# Patient Record
Sex: Male | Born: 1985 | Race: Black or African American | Hispanic: No | Marital: Single | Smoking: Former smoker
Health system: Southern US, Community
[De-identification: ages and names within clinical notes are randomized; demographics above are authoritative.]

---
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
## 2005-12-01 ENCOUNTER — Emergency Department (HOSPITAL_COMMUNITY): Admission: EM | Admit: 2005-12-01 | Discharge: 2005-12-01 | Payer: Self-pay | Admitting: Emergency Medicine

## 2007-08-23 ENCOUNTER — Emergency Department (HOSPITAL_COMMUNITY): Admission: EM | Admit: 2007-08-23 | Discharge: 2007-08-23 | Payer: Self-pay | Admitting: Emergency Medicine

## 2008-10-05 ENCOUNTER — Emergency Department (HOSPITAL_COMMUNITY): Admission: EM | Admit: 2008-10-05 | Discharge: 2008-10-05 | Payer: Self-pay | Admitting: Emergency Medicine

## 2011-04-23 LAB — RAPID STREP SCREEN (MED CTR MEBANE ONLY): Streptococcus, Group A Screen (Direct): NEGATIVE

## 2012-02-23 ENCOUNTER — Emergency Department (HOSPITAL_COMMUNITY)
Admission: EM | Admit: 2012-02-23 | Discharge: 2012-02-23 | Disposition: A | Discharge status: Home / Self Care | Payer: Self-pay | Attending: Emergency Medicine | Admitting: Emergency Medicine

## 2012-02-23 ENCOUNTER — Emergency Department (HOSPITAL_COMMUNITY): Payer: Self-pay

## 2012-02-23 ENCOUNTER — Encounter (HOSPITAL_COMMUNITY): Payer: Self-pay | Admitting: *Deleted

## 2012-02-23 DIAGNOSIS — S52509A Unspecified fracture of the lower end of unspecified radius, initial encounter for closed fracture: Secondary | ICD-10-CM

## 2012-02-23 DIAGNOSIS — S52599A Other fractures of lower end of unspecified radius, initial encounter for closed fracture: Secondary | ICD-10-CM | POA: Insufficient documentation

## 2012-02-23 DIAGNOSIS — F17200 Nicotine dependence, unspecified, uncomplicated: Secondary | ICD-10-CM | POA: Insufficient documentation

## 2012-02-23 MED ORDER — OXYCODONE-ACETAMINOPHEN 5-325 MG PO TABS
1.0000 | ORAL_TABLET | Freq: Once | ORAL | Status: AC
  Administered 2012-02-23: 1 via ORAL
  Filled 2012-02-23: qty 1

## 2012-02-23 MED ORDER — OXYCODONE-ACETAMINOPHEN 5-325 MG PO TABS: 1.0000 | ORAL_TABLET | ORAL | Status: AC | PRN
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
## 2012-02-23 NOTE — ED Notes (Signed)
Ortho paged for splint application. 
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
## 2012-02-23 NOTE — Progress Notes (Signed)
Orthopedic Tech Progress Note Patient Details:  Jack Green [birth year] [id number]  Ortho Devices Type of Ortho Device: Arm foam sling;Sugartong splint;Ace wrap Ortho Device/Splint Location: (L) LE Ortho Device/Splint Interventions: Application   [name] 02/23/2012, 10:40 PM
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
## 2012-02-24 NOTE — ED Provider Notes (Signed)
Medical screening examination/treatment/procedure(s) were performed by non-physician practitioner and as supervising physician I was immediately available for consultation/collaboration.   Carleene Cooper III, MD 02/24/12 1235

## 2012-09-28 ENCOUNTER — Emergency Department (HOSPITAL_COMMUNITY)
Admission: EM | Admit: 2012-09-28 | Discharge: 2012-09-28 | Disposition: A | Discharge status: Home / Self Care | Payer: No Typology Code available for payment source | Attending: Emergency Medicine | Admitting: Emergency Medicine

## 2012-09-28 ENCOUNTER — Encounter (HOSPITAL_COMMUNITY): Payer: Self-pay | Admitting: Adult Health

## 2012-09-28 ENCOUNTER — Emergency Department (HOSPITAL_COMMUNITY): Payer: No Typology Code available for payment source

## 2012-09-28 DIAGNOSIS — Y939 Activity, unspecified: Secondary | ICD-10-CM | POA: Insufficient documentation

## 2012-09-28 DIAGNOSIS — Z23 Encounter for immunization: Secondary | ICD-10-CM | POA: Insufficient documentation

## 2012-09-28 DIAGNOSIS — S8990XA Unspecified injury of unspecified lower leg, initial encounter: Secondary | ICD-10-CM | POA: Insufficient documentation

## 2012-09-28 DIAGNOSIS — S199XXA Unspecified injury of neck, initial encounter: Secondary | ICD-10-CM | POA: Insufficient documentation

## 2012-09-28 DIAGNOSIS — M7918 Myalgia, other site: Secondary | ICD-10-CM

## 2012-09-28 DIAGNOSIS — F17200 Nicotine dependence, unspecified, uncomplicated: Secondary | ICD-10-CM | POA: Insufficient documentation

## 2012-09-28 DIAGNOSIS — Y92410 Unspecified street and highway as the place of occurrence of the external cause: Secondary | ICD-10-CM | POA: Insufficient documentation

## 2012-09-28 DIAGNOSIS — S0993XA Unspecified injury of face, initial encounter: Secondary | ICD-10-CM | POA: Insufficient documentation

## 2012-09-28 MED ORDER — TETANUS-DIPHTH-ACELL PERTUSSIS 5-2.5-18.5 LF-MCG/0.5 IM SUSP
0.5000 mL | Freq: Once | INTRAMUSCULAR | Status: AC
  Administered 2012-09-28: 0.5 mL via INTRAMUSCULAR
  Filled 2012-09-28: qty 0.5

## 2012-09-28 MED ORDER — OXYCODONE-ACETAMINOPHEN 5-325 MG PO TABS: ORAL_TABLET | ORAL | Status: DC

## 2012-09-28 MED ORDER — IBUPROFEN 400 MG PO TABS
400.0000 mg | ORAL_TABLET | Freq: Once | ORAL | Status: AC
  Administered 2012-09-28: 400 mg via ORAL
  Filled 2012-09-28: qty 1

## 2012-09-28 MED ORDER — OXYCODONE-ACETAMINOPHEN 5-325 MG PO TABS
1.0000 | ORAL_TABLET | Freq: Once | ORAL | Status: AC
  Administered 2012-09-28: 1 via ORAL
  Filled 2012-09-28: qty 1

## 2012-09-28 NOTE — ED Provider Notes (Signed)
Medical screening examination/treatment/procedure(s) were performed by non-physician practitioner and as supervising physician I was immediately available for consultation/collaboration.   David H Yao, MD 09/28/12 2327 
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
## 2012-09-29 ENCOUNTER — Emergency Department (HOSPITAL_COMMUNITY): Payer: Self-pay

## 2012-09-29 ENCOUNTER — Encounter (HOSPITAL_COMMUNITY): Payer: Self-pay | Admitting: Emergency Medicine

## 2012-09-29 ENCOUNTER — Emergency Department (HOSPITAL_COMMUNITY)
Admission: EM | Admit: 2012-09-29 | Discharge: 2012-09-29 | Disposition: A | Discharge status: Home / Self Care | Payer: Self-pay | Attending: Emergency Medicine | Admitting: Emergency Medicine

## 2012-09-29 DIAGNOSIS — Y9389 Activity, other specified: Secondary | ICD-10-CM | POA: Insufficient documentation

## 2012-09-29 DIAGNOSIS — F17200 Nicotine dependence, unspecified, uncomplicated: Secondary | ICD-10-CM | POA: Insufficient documentation

## 2012-09-29 DIAGNOSIS — Y92410 Unspecified street and highway as the place of occurrence of the external cause: Secondary | ICD-10-CM | POA: Insufficient documentation

## 2012-09-29 DIAGNOSIS — S8990XA Unspecified injury of unspecified lower leg, initial encounter: Secondary | ICD-10-CM | POA: Insufficient documentation

## 2012-09-29 DIAGNOSIS — H53149 Visual discomfort, unspecified: Secondary | ICD-10-CM | POA: Insufficient documentation

## 2012-09-29 DIAGNOSIS — F0781 Postconcussional syndrome: Secondary | ICD-10-CM | POA: Insufficient documentation

## 2012-09-29 MED ORDER — HYDROCODONE-ACETAMINOPHEN 5-325 MG PO TABS
1.0000 | ORAL_TABLET | Freq: Once | ORAL | Status: AC
  Administered 2012-09-29: 1 via ORAL
  Filled 2012-09-29: qty 1

## 2012-09-29 MED ORDER — ONDANSETRON 4 MG PO TBDP
4.0000 mg | ORAL_TABLET | Freq: Once | ORAL | Status: AC
  Administered 2012-09-29: 4 mg via ORAL
  Filled 2012-09-29: qty 1

## 2012-09-29 MED ORDER — ONDANSETRON 4 MG PO TBDP: ORAL_TABLET | ORAL | Status: DC

## 2012-09-29 MED ORDER — IBUPROFEN 400 MG PO TABS
600.0000 mg | ORAL_TABLET | Freq: Once | ORAL | Status: AC
  Administered 2012-09-29: 600 mg via ORAL
  Filled 2012-09-29: qty 2

## 2012-09-29 MED ORDER — IBUPROFEN 600 MG PO TABS: 600.0000 mg | ORAL_TABLET | Freq: Four times a day (QID) | ORAL | Status: AC | PRN
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
## 2016-03-01 ENCOUNTER — Encounter (HOSPITAL_COMMUNITY): Payer: Self-pay | Admitting: Emergency Medicine

## 2016-03-01 DIAGNOSIS — F17210 Nicotine dependence, cigarettes, uncomplicated: Secondary | ICD-10-CM | POA: Insufficient documentation

## 2016-03-01 DIAGNOSIS — S6992XA Unspecified injury of left wrist, hand and finger(s), initial encounter: Secondary | ICD-10-CM | POA: Insufficient documentation

## 2016-03-01 DIAGNOSIS — Z5321 Procedure and treatment not carried out due to patient leaving prior to being seen by health care provider: Secondary | ICD-10-CM | POA: Insufficient documentation

## 2016-03-01 DIAGNOSIS — Y939 Activity, unspecified: Secondary | ICD-10-CM | POA: Insufficient documentation

## 2016-03-01 DIAGNOSIS — Y929 Unspecified place or not applicable: Secondary | ICD-10-CM | POA: Insufficient documentation

## 2016-03-01 DIAGNOSIS — Y999 Unspecified external cause status: Secondary | ICD-10-CM | POA: Insufficient documentation

## 2016-03-01 DIAGNOSIS — W228XXA Striking against or struck by other objects, initial encounter: Secondary | ICD-10-CM | POA: Insufficient documentation
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
## 2016-03-02 ENCOUNTER — Emergency Department (HOSPITAL_COMMUNITY)
Admission: EM | Admit: 2016-03-02 | Discharge: 2016-03-02 | Disposition: A | Discharge status: Home / Self Care | Payer: Self-pay | Attending: Dermatology | Admitting: Dermatology

## 2016-03-02 ENCOUNTER — Encounter (HOSPITAL_COMMUNITY): Payer: Self-pay | Admitting: Emergency Medicine

## 2016-03-02 ENCOUNTER — Emergency Department (HOSPITAL_COMMUNITY)
Admission: EM | Admit: 2016-03-02 | Discharge: 2016-03-02 | Disposition: A | Discharge status: Home / Self Care | Payer: Self-pay | Attending: Emergency Medicine | Admitting: Emergency Medicine

## 2016-03-02 DIAGNOSIS — W231XXA Caught, crushed, jammed, or pinched between stationary objects, initial encounter: Secondary | ICD-10-CM | POA: Insufficient documentation

## 2016-03-02 DIAGNOSIS — Y999 Unspecified external cause status: Secondary | ICD-10-CM | POA: Insufficient documentation

## 2016-03-02 DIAGNOSIS — L03011 Cellulitis of right finger: Secondary | ICD-10-CM | POA: Insufficient documentation

## 2016-03-02 DIAGNOSIS — Z791 Long term (current) use of non-steroidal anti-inflammatories (NSAID): Secondary | ICD-10-CM | POA: Insufficient documentation

## 2016-03-02 DIAGNOSIS — S61309A Unspecified open wound of unspecified finger with damage to nail, initial encounter: Secondary | ICD-10-CM

## 2016-03-02 DIAGNOSIS — S61101A Unspecified open wound of right thumb with damage to nail, initial encounter: Secondary | ICD-10-CM | POA: Insufficient documentation

## 2016-03-02 DIAGNOSIS — Y939 Activity, unspecified: Secondary | ICD-10-CM | POA: Insufficient documentation

## 2016-03-02 DIAGNOSIS — Y929 Unspecified place or not applicable: Secondary | ICD-10-CM | POA: Insufficient documentation

## 2016-03-02 DIAGNOSIS — IMO0001 Reserved for inherently not codable concepts without codable children: Secondary | ICD-10-CM

## 2016-03-02 DIAGNOSIS — F17210 Nicotine dependence, cigarettes, uncomplicated: Secondary | ICD-10-CM | POA: Insufficient documentation

## 2016-03-02 MED ORDER — LIDOCAINE-EPINEPHRINE 2 %-1:100000 IJ SOLN
20.0000 mL | Freq: Once | INTRAMUSCULAR | Status: AC
  Administered 2016-03-02: 20 mL
  Filled 2016-03-02: qty 1

## 2016-03-02 NOTE — ED Provider Notes (Signed)
[facility] Provider Note   CSN: [id number] Arrival date & time: 03/02/16  3474  First Provider Contact:  First MD Initiated Contact with Patient 03/02/16 [phone]        History   Chief Complaint Chief Complaint  Patient presents with  . finger nail problem  . Hand Pain    HPI Jack Green is a 30 y.o. male.  Patient reports discomfort and pain in his right ring finger over the past several days with developing swelling of the distal aspects surrounding the fingernail.  He reports that he does not bite his fingernails.  No injury or trauma to this region.  He also reports that several weeks ago he slammed his left thumb in a door and reports that his thumb nail on his left is nearly off and he is requesting that it be removed completely.  No fevers or chills.  No other complaints.  Pain is moderate in both fingers with palpation.   The history is provided by the patient and medical records.    History reviewed. No pertinent past medical history.  There are no active problems to display for this patient.   History reviewed. No pertinent surgical history.  OB History    No data available       Home Medications    Prior to Admission medications   Medication Sig Start Date End Date Taking? Authorizing Provider  ibuprofen (ADVIL,MOTRIN) 600 MG tablet Take 1 tablet (600 mg total) by mouth every 6 (six) hours as needed for pain. 09/29/12   Loren Racer, MD  ondansetron (ZOFRAN ODT) 4 MG disintegrating tablet 4mg  ODT q4 hours prn nausea/vomit 09/29/12   Loren Racer, MD  oxyCODONE-acetaminophen (PERCOCET/ROXICET) 5-325 MG per tablet Take 1 tablet by mouth every 4 (four) hours as needed for pain.    Historical Provider, MD    Family History No family history on file.  Social History Social History  Substance Use Topics  . Smoking status: Current Every Day Smoker    Packs/day: 1.00    Types: Cigarettes  . Smokeless tobacco: Never Used  . Alcohol use No      Allergies   Review of patient's allergies indicates no known allergies.   Review of Systems Review of Systems  All other systems reviewed and are negative.    Physical Exam Updated Vital Signs BP 126/84 (BP Location: Right Arm)   Pulse 71   Temp 97.8 F (36.6 C) (Oral)   Resp 19   Ht 5\' 8"  (1.727 m)   Wt 246 lb (111.6 kg)   SpO2 99%   BMI 37.40 kg/m   Physical Exam  Constitutional: He is oriented to person, place, and time. He appears well-developed and well-nourished.  HENT:  Head: Normocephalic.  Eyes: EOM are normal.  Neck: Normal range of motion.  Pulmonary/Chest: Effort normal.  Abdominal: He exhibits no distension.  Musculoskeletal: Normal range of motion.  Right ring finger with paronychia on the radial side without significant swelling or erythema proximal.  No drainage.  Left thumbnail is nearly completely avulsed with evidence of the new nail protruding and growing in an appropriate fashion  Neurological: He is alert and oriented to person, place, and time.  Psychiatric: He has a normal mood and affect.  Nursing note and vitals reviewed.    ED Treatments / Results  Labs (all labs ordered are listed, but only abnormal results are displayed) Labs Reviewed - No data to display  EKG  EKG Interpretation None  Radiology No results found.  Procedures .Nail Removal Performed by: Azalia Bilis Authorized by: Azalia Bilis   Consent:    Consent obtained:  Verbal   Consent given by:  Patient Location:    Hand:  R thumb Pre-procedure details:    Skin preparation:  Betadine Anesthesia (see MAR for exact dosages):    Anesthesia method:  Nerve block   Block needle gauge:  24 G   Block anesthetic:  Lidocaine 2% w/o epi   Block technique:  4   Block injection procedure:  Anatomic landmarks identified   Block outcome:  Anesthesia achieved Nail Removal:    Nail removed:  Complete   Nail bed repaired: no     Removed nail replaced and  anchored: no   Post-procedure details:    Patient tolerance of procedure:  Tolerated well, no immediate complications .Marland KitchenIncision and Drainage Performed by: Azalia Bilis Authorized by: Azalia Bilis   .Nerve Block Performed by: Azalia Bilis Authorized by: Azalia Bilis   Indications:    Indications:  Procedural anesthesia    INCISION AND DRAINAGE Performed by: Lyanne Co Consent: Verbal consent obtained. Risks and benefits: risks, benefits and alternatives were discussed Time out performed prior to procedure Type: abscess Body area: Right ring finger paronychia Anesthesia: local infiltration Incision was made with a scalpel. Local anesthetic: Nerve block (see note) Complexity: simple Drainage: purulent Drainage amount: small Packing material: none Patient tolerance: Patient tolerated the procedure well with no immediate complications.   NERVE BLOCK #1 Performed by: Lyanne Co Consent: Verbal consent obtained. Required items: required blood products, implants, devices, and special equipment available Time out: Immediately prior to procedure a "time out" was called to verify the correct patient, procedure, equipment, support staff and site/side marked as required. Indication: Paronychia incision and drainage  Nerve block body site: Digital nerves right ring finger  Preparation: Patient was prepped and draped in the usual sterile fashion. Needle gauge: 24 G Location technique: anatomical landmarks Local anesthetic: Lidocaine 2% without  Anesthetic total: 4 ml Outcome: pain improved Patient tolerance: Patient tolerated the procedure well with no immediate complications.   NERVE BLOCK #2 Performed by: Lyanne Co Consent: Verbal consent obtained. Required items: required blood products, implants, devices, and special equipment available Time out: Immediately prior to procedure a "time out" was called to verify the correct patient, procedure, equipment,  support staff and site/side marked as required. Indication: Nail removal left thumb  Nerve block body site: Digital nerves left thumb  Preparation: Patient was prepped and draped in the usual sterile fashion. Needle gauge: 24 G Location technique: anatomical landmarks Local anesthetic: Lidocaine 2% without epi  Anesthetic total: 4 ml Outcome: pain improved Patient tolerance: Patient tolerated the procedure well with no immediate complications.         Medications Ordered in ED Medications  lidocaine-EPINEPHrine (XYLOCAINE W/EPI) 2 %-1:100000 (with pres) injection 20 mL (20 mLs Infiltration Given 03/02/16 1133)     Initial Impression / Assessment and Plan / ED Course  I have reviewed the triage vital signs and the nursing notes.  Pertinent labs & imaging results that were available during my care of the patient were reviewed by me and considered in my medical decision making (see chart for details).  Clinical Course    Paronychia right ring finger incision and drainage with purulent discharge.  Warm water soaks recommended.  Nail removal left thumb.  Digital nerve blocks for both.  Please see procedure notes  Final Clinical Impressions(s) / ED Diagnoses  Final diagnoses:  Paronychia of fourth finger, right  Nail avulsion, finger, initial encounter    New Prescriptions New Prescriptions   No medications on file     Azalia Bilis, MD 03/02/16 1141

## 2016-03-02 NOTE — ED Triage Notes (Signed)
Patient states that left thumb nail is having problems since it was shut in door several weeks ago and wants to see if can take the nail off.  Patient also has pain and possible infection to right ring finger x couple days.

## 2016-03-02 NOTE — ED Notes (Signed)
Pt called for room no answer

## 2016-05-06 ENCOUNTER — Emergency Department (HOSPITAL_COMMUNITY)
Admission: EM | Admit: 2016-05-06 | Discharge: 2016-05-06 | Disposition: A | Discharge status: Home / Self Care | Payer: Self-pay | Attending: Emergency Medicine | Admitting: Emergency Medicine

## 2016-05-06 ENCOUNTER — Encounter (HOSPITAL_COMMUNITY): Payer: Self-pay | Admitting: Emergency Medicine

## 2016-05-06 DIAGNOSIS — J069 Acute upper respiratory infection, unspecified: Secondary | ICD-10-CM | POA: Insufficient documentation

## 2016-05-06 DIAGNOSIS — F17210 Nicotine dependence, cigarettes, uncomplicated: Secondary | ICD-10-CM | POA: Insufficient documentation

## 2016-05-06 DIAGNOSIS — J029 Acute pharyngitis, unspecified: Secondary | ICD-10-CM

## 2016-05-06 DIAGNOSIS — Z79899 Other long term (current) drug therapy: Secondary | ICD-10-CM | POA: Insufficient documentation

## 2016-05-06 LAB — RAPID STREP SCREEN (MED CTR MEBANE ONLY): Streptococcus, Group A Screen (Direct): NEGATIVE

## 2016-05-06 MED ORDER — DEXAMETHASONE SODIUM PHOSPHATE 10 MG/ML IJ SOLN
10.0000 mg | Freq: Once | INTRAMUSCULAR | Status: AC
  Administered 2016-05-06: 10 mg via INTRAMUSCULAR
  Filled 2016-05-06: qty 1

## 2016-05-06 MED ORDER — HYDROCODONE-ACETAMINOPHEN 5-325 MG PO TABS: 1.0000 | ORAL_TABLET | Freq: Four times a day (QID) | ORAL | 0 refills | Status: DC | PRN

## 2016-05-06 NOTE — ED Triage Notes (Signed)
Pt reports sore throat for past 4 days with a runny nose.

## 2016-05-06 NOTE — ED Provider Notes (Signed)
[facility] Provider Note   CSN: [id number] Arrival date & time: 05/06/16  4098     History   Chief Complaint Chief Complaint  Patient presents with  . Sore Throat    HPI Jack Green is a 30 y.o. male With no significant past medical history who presents for sore throat. Patient reports that he has had symptoms for the last four days. He says that it is painful to swallow but is able to tolerate some solids and liquids. He describes the pain as burning and nonradiating. He denies neck stiffness or neck pain. Patient has no fevers but does report subjective chills. He denies any history of RPA or PTA. Patient reports some rhinorrhea and congestion. No chest pain, shortness of breath, cough, nausea, vomiting, or other symptoms.  The history is provided by the patient and medical records. No language interpreter was used.  Sore Throat  This is a new problem. The current episode started more than 2 days ago. The problem occurs constantly. The problem has been gradually worsening. Pertinent negatives include no chest pain, no abdominal pain, no headaches and no shortness of breath. The symptoms are aggravated by swallowing. Nothing relieves the symptoms. He has tried nothing for the symptoms. The treatment provided no relief.    History reviewed. No pertinent past medical history.  There are no active problems to display for this patient.   History reviewed. No pertinent surgical history.  OB History    No data available       Home Medications    Prior to Admission medications   Medication Sig Start Date End Date Taking? Authorizing Provider  ibuprofen (ADVIL,MOTRIN) 600 MG tablet Take 1 tablet (600 mg total) by mouth every 6 (six) hours as needed for pain. 09/29/12   Loren Racer, MD  ondansetron (ZOFRAN ODT) 4 MG disintegrating tablet 4mg  ODT q4 hours prn nausea/vomit 09/29/12   Loren Racer, MD  oxyCODONE-acetaminophen (PERCOCET/ROXICET) 5-325 MG per tablet  Take 1 tablet by mouth every 4 (four) hours as needed for pain.    Historical Provider, MD    Family History History reviewed. No pertinent family history.  Social History Social History  Substance Use Topics  . Smoking status: Current Every Day Smoker    Packs/day: 1.00    Types: Cigarettes  . Smokeless tobacco: Never Used  . Alcohol use No     Allergies   Review of patient's allergies indicates no known allergies.   Review of Systems Review of Systems  Constitutional: Positive for chills. Negative for fever.  HENT: Positive for congestion, rhinorrhea and sore throat. Negative for trouble swallowing and voice change.   Eyes: Negative for visual disturbance.  Respiratory: Negative for cough, chest tightness, shortness of breath, wheezing and stridor.   Cardiovascular: Negative for chest pain.  Gastrointestinal: Negative for abdominal pain, diarrhea, nausea and vomiting.  Genitourinary: Negative for difficulty urinating, dysuria, flank pain and frequency.  Musculoskeletal: Negative for back pain, neck pain and neck stiffness.  Skin: Negative for rash.  Neurological: Negative for weakness, light-headedness and headaches.  All other systems reviewed and are negative.    Physical Exam Updated Vital Signs BP 126/77   Pulse 96   Temp 99.6 F (37.6 C) (Oral)   Resp 16   SpO2 95%   Physical Exam  Constitutional: He is oriented to person, place, and time. He appears well-developed and well-nourished. No distress.  HENT:  Head: Atraumatic.  Right Ear: External ear normal.  Left Ear: External ear  normal.  Mouth/Throat: Normal dentition. No uvula swelling. Oropharyngeal exudate and posterior oropharyngeal erythema present. No posterior oropharyngeal edema or tonsillar abscesses.  Eyes: Conjunctivae and EOM are normal. Pupils are equal, round, and reactive to light.  Neck: Normal range of motion. Neck supple. No spinous process tenderness and no muscular tenderness present.  No neck rigidity. No erythema and normal range of motion present.  Cardiovascular: Normal rate and regular rhythm.   No murmur heard. Pulmonary/Chest: Effort normal and breath sounds normal. No stridor. No tachypnea. No respiratory distress. He has no wheezes. He has no rhonchi. He exhibits no tenderness.  Abdominal: Soft. There is no tenderness.  Musculoskeletal: He exhibits no edema.  Neurological: He is alert and oriented to person, place, and time. He exhibits normal muscle tone.  Skin: Skin is warm and dry. He is not diaphoretic.  Psychiatric: He has a normal mood and affect.  Nursing note and vitals reviewed.    ED Treatments / Results  Labs (all labs ordered are listed, but only abnormal results are displayed) Labs Reviewed  RAPID STREP SCREEN (NOT AT [facility])  CULTURE, GROUP A STREP [facility])    EKG  EKG Interpretation None       Radiology No results found.  Procedures Procedures (including critical care time)  Medications Ordered in ED Medications - No data to display   Initial Impression / Assessment and Plan / ED Course  I have reviewed the triage vital signs and the nursing notes.  Pertinent labs & imaging results that were available during my care of the patient were reviewed by me and considered in my medical decision making (see chart for details).  Clinical Course    Jack Green is a 30 y.o. male With no significant past medical history who presents for sore throat.  History and exam are seen above.  Exam showed mild erythema with some tonsillar exit date but uvula was midline, patient had full range of motion of neck, and no tenderness/edema undertone. Doubt meningitis, PTA, Ludwig's angina, or other deep infection of the neck.  Given patient severe sore throat and severe pain with swallowing, Decadron order to help with inflammation. Rapid strep was sent and negative. Throat culture obtained.  Patient reports feeling better after steroids. The  old patient likely has a viral pharyngitis/URI. Patient was given conservative management recommendations and instructions to follow up with PCP. Patient given prescription for pain medication.  Patient had no other questions or concerns and patient discharged in good condition.    Final Clinical Impressions(s) / ED Diagnoses   Final diagnoses:  Upper respiratory tract infection, unspecified type  Pharyngitis, unspecified etiology    New Prescriptions Discharge Medication List as of 05/06/2016  9:46 AM    START taking these medications   Details  HYDROcodone-acetaminophen (NORCO/VICODIN) 5-325 MG tablet Take 1 tablet by mouth every 6 (six) hours as needed., Starting Thu 05/06/2016, Print        Clinical Impression: 1. Upper respiratory tract infection, unspecified type   2. Pharyngitis, unspecified etiology     Disposition: Discharge  Condition: Good  I have discussed the results, Dx and Tx plan with the pt(& family if present). He/she/they expressed understanding and agree(s) with the plan. Discharge instructions discussed at great length. Strict return precautions discussed and pt &/or family have verbalized understanding of the instructions. No further questions at time of discharge.    Discharge Medication List as of 05/06/2016  9:46 AM    START taking these medications  Details  HYDROcodone-acetaminophen (NORCO/VICODIN) 5-325 MG tablet Take 1 tablet by mouth every 6 (six) hours as needed., Starting Thu 05/06/2016, Print        [facility] [address] [place] [id number] [phone] [id number] Schedule an appointment as soon as possible for a visit  If symptoms worsen, please return to the nearest ED.     Heide Scaleshristopher J Kamarah Bilotta, MD 05/06/16 [phone]

## 2016-05-08 LAB — CULTURE, GROUP A STREP (THRC)

## 2017-10-24 ENCOUNTER — Ambulatory Visit (HOSPITAL_COMMUNITY)
Admission: EM | Admit: 2017-10-24 | Discharge: 2017-10-24 | Disposition: A | Discharge status: Home / Self Care | Payer: Self-pay | Attending: Family Medicine | Admitting: Family Medicine

## 2017-10-24 ENCOUNTER — Ambulatory Visit (INDEPENDENT_AMBULATORY_CARE_PROVIDER_SITE_OTHER): Payer: Self-pay

## 2017-10-24 ENCOUNTER — Encounter (HOSPITAL_COMMUNITY): Payer: Self-pay | Admitting: Family Medicine

## 2017-10-24 DIAGNOSIS — J111 Influenza due to unidentified influenza virus with other respiratory manifestations: Secondary | ICD-10-CM | POA: Insufficient documentation

## 2017-10-24 DIAGNOSIS — R69 Illness, unspecified: Secondary | ICD-10-CM

## 2017-10-24 DIAGNOSIS — F17210 Nicotine dependence, cigarettes, uncomplicated: Secondary | ICD-10-CM | POA: Insufficient documentation

## 2017-10-24 DIAGNOSIS — Z79899 Other long term (current) drug therapy: Secondary | ICD-10-CM | POA: Insufficient documentation

## 2017-10-24 LAB — POCT RAPID STREP A: Streptococcus, Group A Screen (Direct): NEGATIVE

## 2017-10-24 MED ORDER — FLUTICASONE PROPIONATE 50 MCG/ACT NA SUSP: 2.0000 | Freq: Every day | NASAL | 0 refills | Status: AC

## 2017-10-24 MED ORDER — BENZONATATE 100 MG PO CAPS: 100.0000 mg | ORAL_CAPSULE | Freq: Three times a day (TID) | ORAL | 0 refills | Status: DC

## 2017-10-24 MED ORDER — ONDANSETRON 4 MG PO TBDP: 4.0000 mg | ORAL_TABLET | Freq: Three times a day (TID) | ORAL | 0 refills | Status: AC | PRN

## 2017-10-24 MED ORDER — IPRATROPIUM BROMIDE 0.06 % NA SOLN: 2.0000 | Freq: Four times a day (QID) | NASAL | 0 refills | Status: AC

## 2017-10-24 MED ORDER — ACETAMINOPHEN 325 MG PO TABS
ORAL_TABLET | ORAL | Status: AC
  Filled 2017-10-24: qty 2

## 2017-10-24 MED ORDER — MELOXICAM 7.5 MG PO TABS: 7.5000 mg | ORAL_TABLET | Freq: Every day | ORAL | 0 refills | Status: AC

## 2017-10-24 MED ORDER — ACETAMINOPHEN 325 MG PO TABS
650.0000 mg | ORAL_TABLET | Freq: Once | ORAL | Status: AC
Start: 2017-10-24 — End: 2017-10-24
  Administered 2017-10-24: 650 mg via ORAL

## 2017-10-24 NOTE — Discharge Instructions (Addendum)
Rapid strep negative. Chest xray negative for pneumonia. Tessalon for cough. Mobic for body aches. Do not take ibuprofen/naproxen while on mobic. Start flonase, atrovent nasal spray for nasal congestion/drainage. You can use over the counter nasal saline rinse such as neti pot for nasal congestion. Keep hydrated, your urine should be clear to pale yellow in color. Tylenol/motrin for fever and pain. Monitor for any worsening of symptoms, chest pain, shortness of breath, wheezing, swelling of the throat, follow up for reevaluation.   For sore throat try using a honey-based tea. Use 3 teaspoons of honey with juice squeezed from half lemon. Place shaved pieces of ginger into 1/2-1 cup of water and warm over stove top. Then mix the ingredients and repeat every 4 hours as needed.

## 2017-10-24 NOTE — ED Provider Notes (Signed)
[facility]    CSN: [id number] Arrival date & time: 10/24/17  1351     History   Chief Complaint Chief Complaint  Patient presents with  . Cough    HPI Jack Green is a 32 y.o. male.   32 year old male comes in for 5-day history of flulike symptoms.  He has had productive cough, rhinorrhea, nasal congestion, body aches.  He now has chest pain and back pain while coughing.  Had 2 episodes of nonbilious nonbloody vomit today.  Unknown fever, though with chills.  Otherwise has been eating and drinking without problems.  OTC TheraFlu and DayQuil without relief.  Former smoker, quit 2 years ago, 12-15-pack-year history.      History reviewed. No pertinent past medical history.  There are no active problems to display for this patient.   History reviewed. No pertinent surgical history.     Home Medications    Prior to Admission medications   Medication Sig Start Date End Date Taking? Authorizing Provider  benzonatate (TESSALON) 100 MG capsule Take 1 capsule (100 mg total) by mouth every 8 (eight) hours. 10/24/17   Cathie Hoops, Shawna Kiener V, PA-C  fluticasone (FLONASE) 50 MCG/ACT nasal spray Place 2 sprays into both nostrils daily. 10/24/17   Cathie Hoops, Chrishawna Farina V, PA-C  ibuprofen (ADVIL,MOTRIN) 600 MG tablet Take 1 tablet (600 mg total) by mouth every 6 (six) hours as needed for pain. 09/29/12   Loren Racer, MD  ipratropium (ATROVENT) 0.06 % nasal spray Place 2 sprays into both nostrils 4 (four) times daily. 10/24/17   Cathie Hoops, Dakari Cregger V, PA-C  meloxicam (MOBIC) 7.5 MG tablet Take 1 tablet (7.5 mg total) by mouth daily. 10/24/17   Cathie Hoops, Merlina Marchena V, PA-C  ondansetron (ZOFRAN ODT) 4 MG disintegrating tablet Take 1 tablet (4 mg total) by mouth every 8 (eight) hours as needed for nausea or vomiting. 10/24/17   Belinda Fisher, PA-C    Family History History reviewed. No pertinent family history.  Social History Social History   Tobacco Use  . Smoking status: Current Every Day Smoker    Packs/day: 1.00   Types: Cigarettes  . Smokeless tobacco: Never Used  Substance Use Topics  . Alcohol use: No  . Drug use: No     Allergies   Patient has no known allergies.   Review of Systems Review of Systems  Reason unable to perform ROS: See HPI as above.     Physical Exam Triage Vital Signs ED Triage Vitals  Enc Vitals Group     BP 10/24/17 1418 137/68     Pulse Rate 10/24/17 1418 (!) 105     Resp 10/24/17 1418 18     Temp 10/24/17 1418 (!) 102.6 F (39.2 C)     Temp src --      SpO2 10/24/17 1418 100 %     Weight --      Height --      Head Circumference --      Peak Flow --      Pain Score 10/24/17 1416 8     Pain Loc --      Pain Edu? --      Excl. in GC? --    No data found.  Updated Vital Signs BP 137/68   Pulse (!) 105   Temp (!) 102.6 F (39.2 C)   Resp 18   SpO2 100%   Physical Exam  Constitutional: He is oriented to person, place, and time. He appears well-developed and well-nourished.  No distress.  HENT:  Head: Normocephalic and atraumatic.  Right Ear: External ear and ear canal normal. Tympanic membrane is erythematous. Tympanic membrane is not bulging.  Left Ear: External ear and ear canal normal. Tympanic membrane is erythematous. Tympanic membrane is not bulging.  Nose: Mucosal edema and rhinorrhea present. Right sinus exhibits no maxillary sinus tenderness and no frontal sinus tenderness. Left sinus exhibits no maxillary sinus tenderness and no frontal sinus tenderness.  Mouth/Throat: Uvula is midline and mucous membranes are normal. Posterior oropharyngeal erythema present. No tonsillar exudate.  Eyes: Pupils are equal, round, and reactive to light. Conjunctivae are normal.  Neck: Normal range of motion. Neck supple.  Cardiovascular: Normal rate, regular rhythm and normal heart sounds. Exam reveals no gallop and no friction rub.  No murmur heard. Pulmonary/Chest: Effort normal and breath sounds normal. He has no decreased breath sounds. He has no  wheezes. He has no rhonchi. He has no rales.  Lymphadenopathy:    He has no cervical adenopathy.  Neurological: He is alert and oriented to person, place, and time.  Skin: Skin is warm and dry.  Psychiatric: He has a normal mood and affect. His behavior is normal. Judgment normal.     UC Treatments / Results  Labs (all labs ordered are listed, but only abnormal results are displayed) Labs Reviewed  CULTURE, GROUP A STREP [facility]  POCT RAPID STREP A    EKG None Radiology Dg Chest 2 View  Result Date: 10/24/2017 CLINICAL DATA:  Is fever, productive cough EXAM: CHEST - 2 VIEW COMPARISON:  08/23/2007 FINDINGS: Heart and mediastinal contours are within normal limits. No focal opacities or effusions. No acute bony abnormality. IMPRESSION: No active cardiopulmonary disease. Electronically Signed   By: Charlett NoseKevin  Dover M.D.   On: 10/24/2017 15:21    Procedures Procedures (including critical care time)  Medications Ordered in UC Medications  acetaminophen (TYLENOL) tablet 650 mg (650 mg Oral Given 10/24/17 1421)     Initial Impression / Assessment and Plan / UC Course  I have reviewed the triage vital signs and the nursing notes.  Pertinent labs & imaging results that were available during my care of the patient were reviewed by me and considered in my medical decision making (see chart for details).    Rapid strep negative.  Chest x-ray negative for pneumonia.  Patient nontoxic in appearance.  Discussed possible flu causing symptoms.  Given outside of treatment range for Tamiflu, will provide symptomatic treatment.  Push fluids.  Return precautions given.  Final Clinical Impressions(s) / UC Diagnoses   Final diagnoses:  Influenza-like illness    ED Discharge Orders        Ordered    meloxicam (MOBIC) 7.5 MG tablet  Daily     10/24/17 1541    benzonatate (TESSALON) 100 MG capsule  Every 8 hours     10/24/17 1541    fluticasone (FLONASE) 50 MCG/ACT nasal spray  Daily      10/24/17 1541    ipratropium (ATROVENT) 0.06 % nasal spray  4 times daily     10/24/17 1541    ondansetron (ZOFRAN ODT) 4 MG disintegrating tablet  Every 8 hours PRN     10/24/17 1541        Belinda FisherYu, Aubryanna Nesheim V, PA-C 10/24/17 1544

## 2017-10-24 NOTE — ED Triage Notes (Signed)
Pt here for cough, congestion, chest pain, body aches for 5 days. Taking thera flu and dayquil

## 2017-10-26 ENCOUNTER — Telehealth: Payer: Self-pay | Admitting: Internal Medicine

## 2017-10-26 LAB — CULTURE, GROUP A STREP (THRC)

## 2017-10-26 NOTE — Telephone Encounter (Signed)
Clinical staff, please let patient know that throat culture was positive for group A Strep germ.  Please send prescription for penicillin V 500 mg twice daily times 10 days #20 no refills to the pharmacy. Recheck for further evaluation if symptoms are not improving.  LM

## 2017-11-01 ENCOUNTER — Telehealth (HOSPITAL_COMMUNITY): Payer: Self-pay

## 2017-11-01 MED ORDER — PENICILLIN V POTASSIUM 500 MG PO TABS: 500.0000 mg | ORAL_TABLET | Freq: Two times a day (BID) | ORAL | 0 refills | Status: AC

## 2017-11-01 NOTE — Telephone Encounter (Signed)
Contacted patient regarding test results. Prescription sent to pharmacy of patients choice. Verbalized understanding.

## 2019-06-22 ENCOUNTER — Other Ambulatory Visit: Payer: Self-pay

## 2019-06-22 DIAGNOSIS — Z20822 Contact with and (suspected) exposure to covid-19: Secondary | ICD-10-CM

## 2019-06-25 LAB — NOVEL CORONAVIRUS, NAA: SARS-CoV-2, NAA: NOT DETECTED

## 2019-07-16 ENCOUNTER — Other Ambulatory Visit: Payer: Self-pay

## 2019-07-16 DIAGNOSIS — Z20822 Contact with and (suspected) exposure to covid-19: Secondary | ICD-10-CM

## 2019-07-18 LAB — NOVEL CORONAVIRUS, NAA: SARS-CoV-2, NAA: NOT DETECTED

## 2019-08-18 ENCOUNTER — Emergency Department (HOSPITAL_COMMUNITY)
Admission: EM | Admit: 2019-08-18 | Discharge: 2019-08-18 | Disposition: A | Discharge status: Home / Self Care | Payer: 59 | Attending: Emergency Medicine | Admitting: Emergency Medicine

## 2019-08-18 ENCOUNTER — Other Ambulatory Visit: Payer: Self-pay

## 2019-08-18 ENCOUNTER — Encounter (HOSPITAL_COMMUNITY): Payer: Self-pay | Admitting: Emergency Medicine

## 2019-08-18 ENCOUNTER — Emergency Department (HOSPITAL_COMMUNITY): Payer: 59

## 2019-08-18 DIAGNOSIS — F17210 Nicotine dependence, cigarettes, uncomplicated: Secondary | ICD-10-CM | POA: Insufficient documentation

## 2019-08-18 DIAGNOSIS — Y999 Unspecified external cause status: Secondary | ICD-10-CM | POA: Diagnosis not present

## 2019-08-18 DIAGNOSIS — X58XXXA Exposure to other specified factors, initial encounter: Secondary | ICD-10-CM | POA: Diagnosis not present

## 2019-08-18 DIAGNOSIS — Y939 Activity, unspecified: Secondary | ICD-10-CM | POA: Insufficient documentation

## 2019-08-18 DIAGNOSIS — Z79899 Other long term (current) drug therapy: Secondary | ICD-10-CM | POA: Diagnosis not present

## 2019-08-18 DIAGNOSIS — S43401A Unspecified sprain of right shoulder joint, initial encounter: Secondary | ICD-10-CM | POA: Insufficient documentation

## 2019-08-18 DIAGNOSIS — Y929 Unspecified place or not applicable: Secondary | ICD-10-CM | POA: Insufficient documentation

## 2019-08-18 DIAGNOSIS — S46911A Strain of unspecified muscle, fascia and tendon at shoulder and upper arm level, right arm, initial encounter: Secondary | ICD-10-CM

## 2019-08-18 DIAGNOSIS — S4991XA Unspecified injury of right shoulder and upper arm, initial encounter: Secondary | ICD-10-CM | POA: Diagnosis present

## 2019-08-18 NOTE — ED Triage Notes (Signed)
Pt c/o R shoulder pain x several months, unkn injury, pain has progressed to difficulty lifting objects. No radiation or tingling.

## 2019-08-18 NOTE — ED Provider Notes (Signed)
[facility] EMERGENCY DEPARTMENT Provider Note   CSN: [id number] Arrival date & time: 08/18/19  1912     History Chief Complaint  Patient presents with  . Shoulder Pain    Jack Green is a 34 y.o. male.  34 year old who presents for right shoulder pain.  Patient with no known injury.  Patient states his right shoulder has been hurting for a few months, but over the past day or 2 patient has noted weakness when he tries to lift something past 90 degrees.  No pain without lifting.  No numbness, no weakness.  The history is provided by the patient.  Shoulder Pain Location:  Shoulder Shoulder location:  R shoulder Injury: no   Pain details:    Quality:  Aching   Radiates to:  Does not radiate   Severity:  Mild   Onset quality:  Sudden   Timing:  Constant   Progression:  Worsening Dislocation: no   Foreign body present:  No foreign bodies Prior injury to area:  No Relieved by:  None tried Worsened by:  Movement and bearing weight Ineffective treatments:  None tried Associated symptoms: no back pain, no fatigue, no fever, no muscle weakness, no numbness, no stiffness, no swelling and no tingling   Risk factors: no frequent fractures        History reviewed. No pertinent past medical history.  There are no problems to display for this patient.   History reviewed. No pertinent surgical history.     No family history on file.  Social History   Tobacco Use  . Smoking status: Current Every Day Smoker    Packs/day: 1.00    Types: Cigarettes  . Smokeless tobacco: Never Used  Substance Use Topics  . Alcohol use: No  . Drug use: No    Home Medications Prior to Admission medications   Medication Sig Start Date End Date Taking? Authorizing Provider  benzonatate (TESSALON) 100 MG capsule Take 1 capsule (100 mg total) by mouth every 8 (eight) hours. 10/24/17   Cathie Hoops, Amy V, PA-C  fluticasone (FLONASE) 50 MCG/ACT nasal spray Place 2 sprays into both  nostrils daily. 10/24/17   Cathie Hoops, Amy V, PA-C  ibuprofen (ADVIL,MOTRIN) 600 MG tablet Take 1 tablet (600 mg total) by mouth every 6 (six) hours as needed for pain. 09/29/12   Loren Racer, MD  ipratropium (ATROVENT) 0.06 % nasal spray Place 2 sprays into both nostrils 4 (four) times daily. 10/24/17   Cathie Hoops, Amy V, PA-C  meloxicam (MOBIC) 7.5 MG tablet Take 1 tablet (7.5 mg total) by mouth daily. 10/24/17   Cathie Hoops, Amy V, PA-C  ondansetron (ZOFRAN ODT) 4 MG disintegrating tablet Take 1 tablet (4 mg total) by mouth every 8 (eight) hours as needed for nausea or vomiting. 10/24/17   Belinda Fisher, PA-C    Allergies    Patient has no known allergies.  Review of Systems   Review of Systems  Constitutional: Negative for fatigue and fever.  Musculoskeletal: Negative for back pain and stiffness.  All other systems reviewed and are negative.   Physical Exam Updated Vital Signs BP 115/69 (BP Location: Left Arm)   Pulse 80   Temp 98.6 F (37 C) (Oral)   Resp 19   Ht 5\' 7"  (1.702 m)   Wt 104 kg   SpO2 100%   BMI 35.91 kg/m   Physical Exam Vitals and nursing note reviewed.  Constitutional:      Appearance: He is well-developed.  HENT:  Head: Normocephalic.     Right Ear: External ear normal.     Left Ear: External ear normal.  Eyes:     Conjunctiva/sclera: Conjunctivae normal.  Cardiovascular:     Rate and Rhythm: Normal rate.     Heart sounds: Normal heart sounds.  Pulmonary:     Effort: Pulmonary effort is normal.     Breath sounds: Normal breath sounds.  Abdominal:     General: Bowel sounds are normal.     Palpations: Abdomen is soft.  Musculoskeletal:        General: Tenderness present.     Cervical back: Normal range of motion and neck supple.     Comments: Patient with tenderness to palpation along the [facility] joint.  Pain is worse when raising arm past 90.  No pain in the elbow.  Neurovascularly intact.  Patient is able to roll shoulders forward.  He was able to bend his arm behind his  back.  Patient states the pain and weakness, when he tries to lift something above 90 degrees.  Skin:    General: Skin is warm and dry.  Neurological:     Mental Status: He is alert and oriented to person, place, and time.     ED Results / Procedures / Treatments   Labs (all labs ordered are listed, but only abnormal results are displayed) Labs Reviewed - No data to display  EKG None  Radiology DG Shoulder Right  Result Date: 08/18/2019 CLINICAL DATA:  Right shoulder pain. No known injury. EXAM: RIGHT SHOULDER - 2+ VIEW COMPARISON:  None. FINDINGS: There is no evidence of fracture or dislocation. There is no evidence of arthropathy or other focal bone abnormality. Small well defined calcific density adjacent to the rotator cuff insertion anteriorly. IMPRESSION: Small calcific density adjacent to the rotator cuff insertion anteriorly may be calcific tendinopathy. Otherwise unremarkable radiographs of the right shoulder. Electronically Signed   By: Keith Rake M.D.   On: 08/18/2019 20:23    Procedures Procedures (including critical care time)  Medications Ordered in ED Medications - No data to display  ED Course  I have reviewed the triage vital signs and the nursing notes.  Pertinent labs & imaging results that were available during my care of the patient were reviewed by me and considered in my medical decision making (see chart for details).    MDM Rules/Calculators/A&P                      34 year old with right shoulder pain, acute on chronic.  Patient with weakness when lifting over the past day or 2.  No known injury.  Will obtain x-rays.  X-rays visualized by me, no fracture or dislocation.  Patient does have a calcification near the rotator cuff insertion.  This could be tendinitis.  Discussed use of ibuprofen and rest.  Discussed that this could be a rotator cuff tear, and patient will need follow-up with an orthopedic doctor.  Patient comfortable with  plan. Final Clinical Impression(s) / ED Diagnoses Final diagnoses:  Strain of right shoulder, initial encounter    Rx / DC Orders ED Discharge Orders    None       Louanne Skye, MD 08/18/19 2219

## 2019-08-18 NOTE — ED Notes (Signed)
RN went over d/c instructions with pt who verbalized understanding. Pt alert and no distress noted when ambulated to exit.  

## 2019-09-02 IMAGING — DX DG CHEST 2V
2 series · 2 of 2 positions shown · non-contrast
Comparison: 08/23/2007

CLINICAL DATA: Is fever, productive cough

EXAM:
CHEST - 2 VIEW

[chest pa]
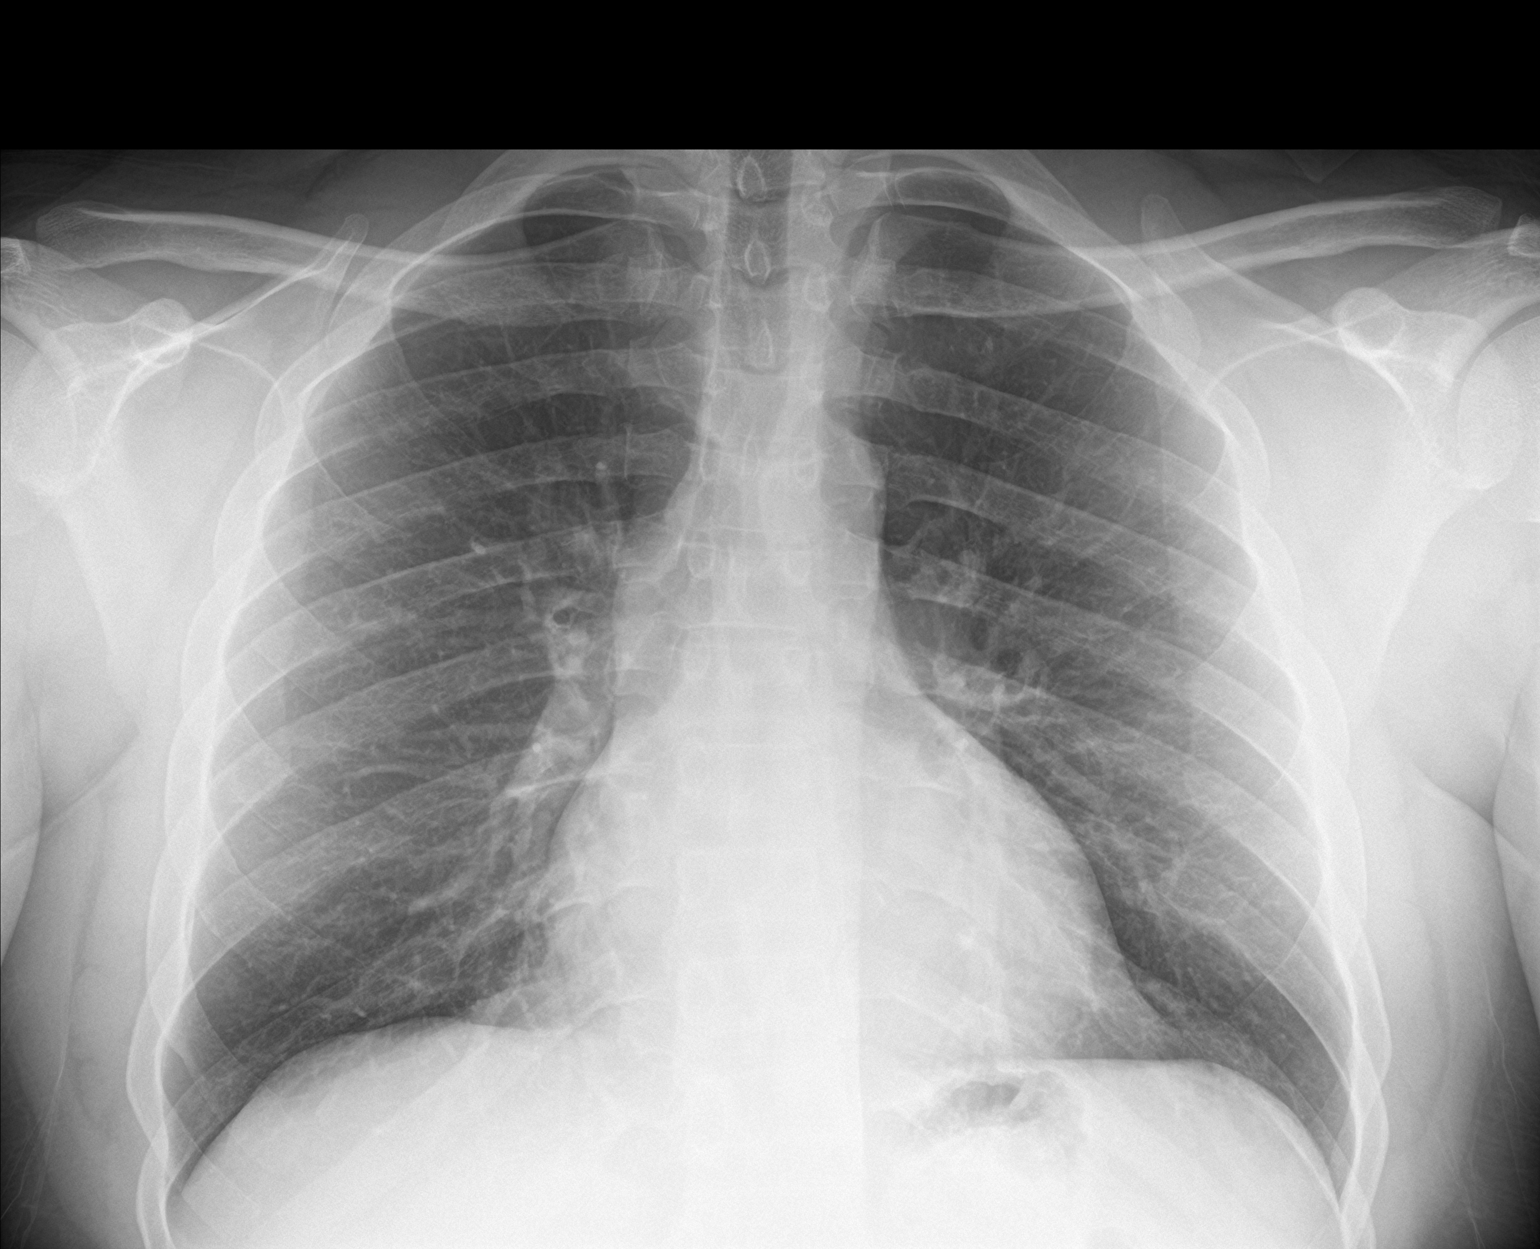

[chest lat]
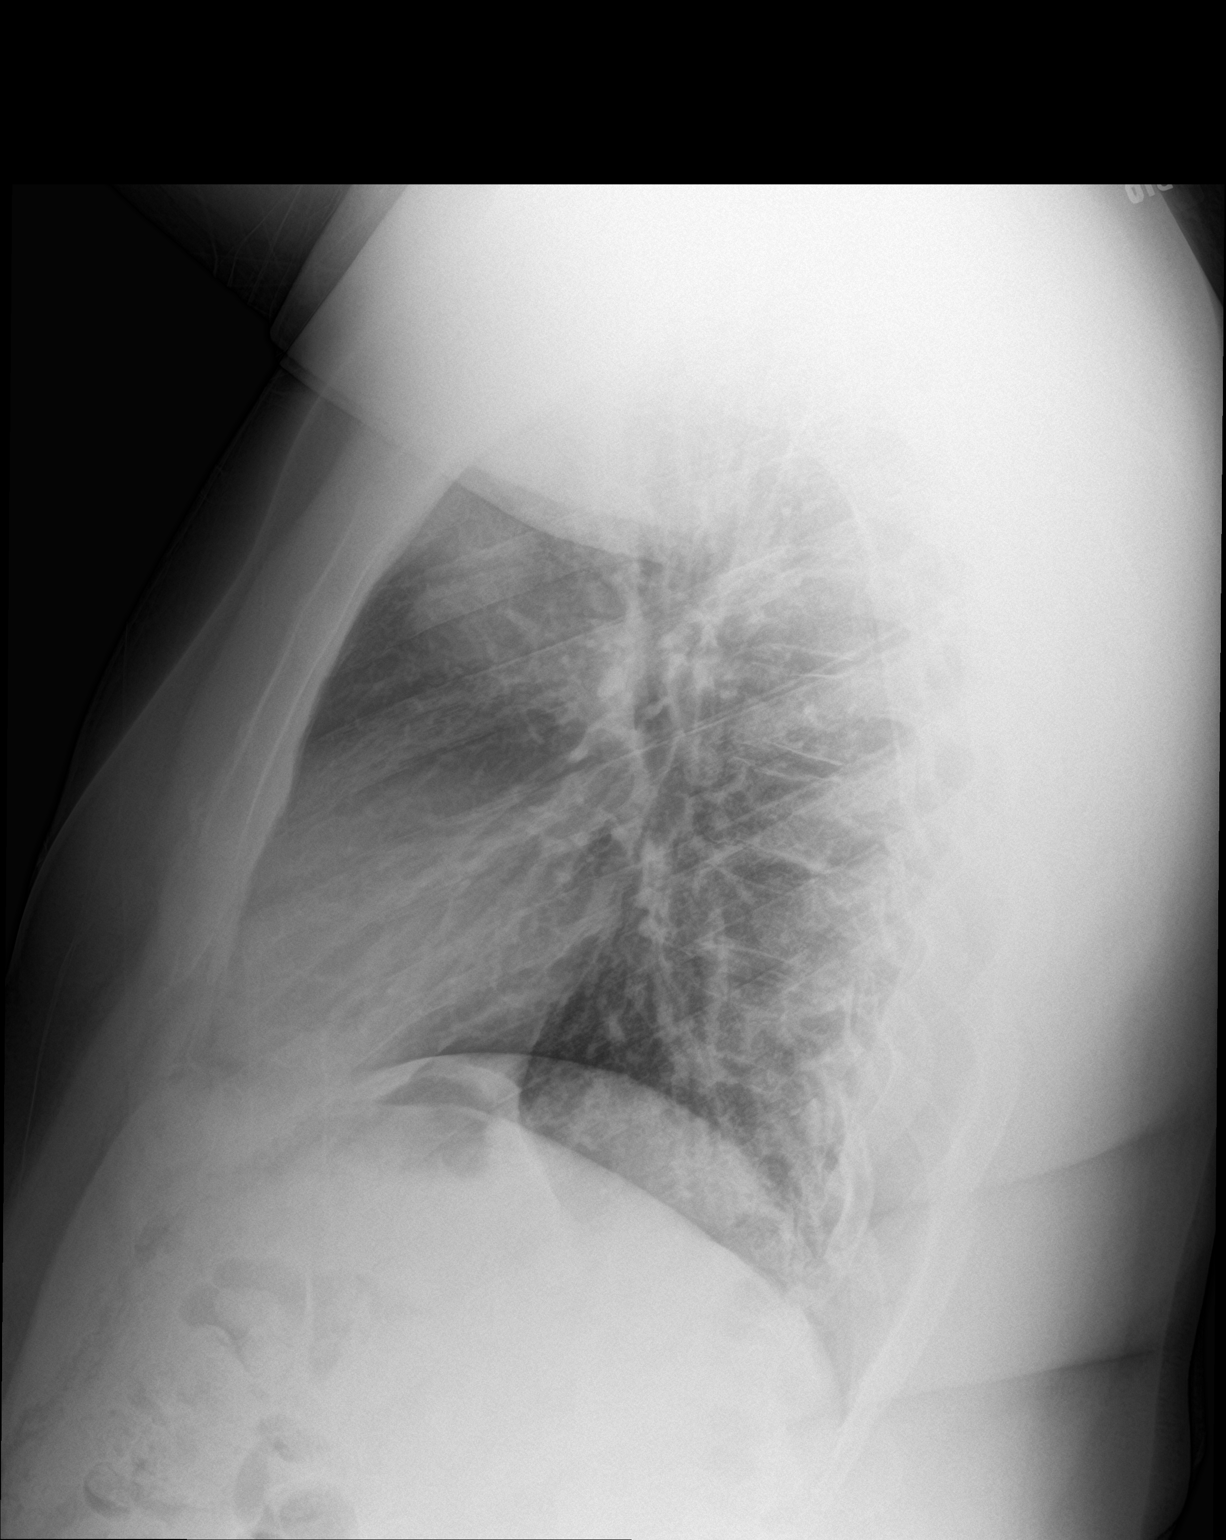

[2 of 2 positions shown; findings below may reference images not displayed]

FINDINGS: Heart and mediastinal contours are within normal limits. No focal
opacities or effusions. No acute bony abnormality.
IMPRESSION: No active cardiopulmonary disease.

## 2020-05-26 ENCOUNTER — Other Ambulatory Visit: Payer: Self-pay

## 2020-05-26 ENCOUNTER — Encounter (HOSPITAL_COMMUNITY): Payer: Self-pay | Admitting: Emergency Medicine

## 2020-05-26 ENCOUNTER — Emergency Department (HOSPITAL_COMMUNITY)
Admission: EM | Admit: 2020-05-26 | Discharge: 2020-05-26 | Disposition: A | Discharge status: Home / Self Care | Payer: 59 | Attending: Emergency Medicine | Admitting: Emergency Medicine

## 2020-05-26 DIAGNOSIS — F17210 Nicotine dependence, cigarettes, uncomplicated: Secondary | ICD-10-CM | POA: Insufficient documentation

## 2020-05-26 DIAGNOSIS — K0889 Other specified disorders of teeth and supporting structures: Secondary | ICD-10-CM

## 2020-05-26 DIAGNOSIS — K029 Dental caries, unspecified: Secondary | ICD-10-CM

## 2020-05-26 MED ORDER — OXYCODONE-ACETAMINOPHEN 5-325 MG PO TABS
1.0000 | ORAL_TABLET | Freq: Once | ORAL | Status: AC
  Administered 2020-05-26: 1 via ORAL
  Filled 2020-05-26: qty 1

## 2020-05-26 MED ORDER — PENICILLIN V POTASSIUM 500 MG PO TABS: 500.0000 mg | ORAL_TABLET | Freq: Three times a day (TID) | ORAL | 0 refills | Status: DC

## 2020-05-26 MED ORDER — PENICILLIN V POTASSIUM 250 MG PO TABS
500.0000 mg | ORAL_TABLET | Freq: Once | ORAL | Status: AC
  Administered 2020-05-26: 500 mg via ORAL
  Filled 2020-05-26: qty 2

## 2020-05-26 NOTE — ED Provider Notes (Signed)
[facility] EMERGENCY DEPARTMENT Provider Note   CSN: [id number] Arrival date & time: 05/26/20  0054     History Chief Complaint  Patient presents with  . Dental Pain    Jack Green is a 34 y.o. male with no medical problems presents to the Emergency Department complaining of gradual, persistent, progressively worsening right upper dental pain onset 24 hours ago. Associated symptoms include radiation of pain into the jaw.  Patient reports he has seen a dentist within the last year and they recommended a root canal for this tooth.  Eating and drinking makes symptoms worse.  Patient is taking ibuprofen and Goody powders with some relief.  No fevers or chills, headache, vision changes, facial swelling, neck pain, neck stiffness, nausea, vomiting.   The history is provided by the patient and medical records. No language interpreter was used.       History reviewed. No pertinent past medical history.  There are no problems to display for this patient.   History reviewed. No pertinent surgical history.     No family history on file.  Social History   Tobacco Use  . Smoking status: Current Every Day Smoker    Packs/day: 1.00    Types: Cigarettes  . Smokeless tobacco: Never Used  Substance Use Topics  . Alcohol use: No  . Drug use: No    Home Medications Prior to Admission medications   Medication Sig Start Date End Date Taking? Authorizing Provider  benzonatate (TESSALON) 100 MG capsule Take 1 capsule (100 mg total) by mouth every 8 (eight) hours. 10/24/17   Cathie Hoops, Amy V, PA-C  fluticasone (FLONASE) 50 MCG/ACT nasal spray Place 2 sprays into both nostrils daily. 10/24/17   Cathie Hoops, Amy V, PA-C  ibuprofen (ADVIL,MOTRIN) 600 MG tablet Take 1 tablet (600 mg total) by mouth every 6 (six) hours as needed for pain. 09/29/12   Loren Racer, MD  ipratropium (ATROVENT) 0.06 % nasal spray Place 2 sprays into both nostrils 4 (four) times daily. 10/24/17   Cathie Hoops, Amy V,  PA-C  meloxicam (MOBIC) 7.5 MG tablet Take 1 tablet (7.5 mg total) by mouth daily. 10/24/17   Cathie Hoops, Amy V, PA-C  ondansetron (ZOFRAN ODT) 4 MG disintegrating tablet Take 1 tablet (4 mg total) by mouth every 8 (eight) hours as needed for nausea or vomiting. 10/24/17   Cathie Hoops, Amy V, PA-C  penicillin v potassium (VEETID) 500 MG tablet Take 1 tablet (500 mg total) by mouth 3 (three) times daily. 05/26/20   Wayburn Shaler, Dahlia Client, PA-C    Allergies    Patient has no known allergies.  Review of Systems   Review of Systems  Constitutional: Negative for fever.  HENT: Positive for dental problem. Negative for facial swelling.   Eyes: Negative for visual disturbance.  Respiratory: Negative for shortness of breath.   Cardiovascular: Negative for chest pain.  Gastrointestinal: Negative for nausea and vomiting.  Musculoskeletal: Negative for neck pain.  Skin: Negative for rash.  Allergic/Immunologic: Negative for immunocompromised state.  Neurological: Negative for headaches.  Hematological: Negative for adenopathy.  Psychiatric/Behavioral: The patient is not nervous/anxious.     Physical Exam Updated Vital Signs BP 140/85 (BP Location: Left Arm)   Pulse 63   Temp 98.4 F (36.9 C) (Oral)   Resp 20   Ht 5\' 10"  (1.778 m)   Wt 120 kg   SpO2 100%   BMI 37.96 kg/m   Physical Exam Vitals and nursing note reviewed.  Constitutional:  Appearance: He is well-developed.  HENT:     Head: Normocephalic.     Right Ear: External ear normal.     Left Ear: External ear normal.     Nose: Nose normal.     Right Sinus: No maxillary sinus tenderness or frontal sinus tenderness.     Left Sinus: No maxillary sinus tenderness or frontal sinus tenderness.     Mouth/Throat:     Mouth: No lacerations or oral lesions.     Dentition: Abnormal dentition. Dental caries present.     Pharynx: Uvula midline. No oropharyngeal exudate, posterior oropharyngeal erythema or uvula swelling.     Tonsils: No tonsillar  abscesses.      Comments: No sublingual tenderness or induration. Eyes:     General:        Right eye: No discharge.        Left eye: No discharge.     Conjunctiva/sclera: Conjunctivae normal.  Neck:     Comments: No stridor Handling secretions without difficulty No nuchal rigidity No cervical lymphadenopathy  Cardiovascular:     Rate and Rhythm: Normal rate and regular rhythm.  Pulmonary:     Effort: Pulmonary effort is normal. No respiratory distress.  Abdominal:     General: There is no distension.  Musculoskeletal:     Cervical back: Normal range of motion and neck supple.  Lymphadenopathy:     Cervical: No cervical adenopathy.  Skin:    General: Skin is warm and dry.     Comments: No rash  Neurological:     Mental Status: He is alert.     ED Results / Procedures / Treatments    Procedures Procedures (including critical care time)  Medications Ordered in ED Medications  penicillin v potassium (VEETID) tablet 500 mg (has no administration in time range)  oxyCODONE-acetaminophen (PERCOCET/ROXICET) 5-325 MG per tablet 1 tablet (1 tablet Oral Given 05/26/20 0110)    ED Course  I have reviewed the triage vital signs and the nursing notes.  Pertinent labs & imaging results that were available during my care of the patient were reviewed by me and considered in my medical decision making (see chart for details).    MDM Rules/Calculators/A&P                           Patient with toothache.  No gross abscess.  Exam unconcerning for Ludwig's angina or spread of infection.  Will treat with penicillin and anti-inflammatories medicine.  Urged patient to follow-up with dentist.     Final Clinical Impression(s) / ED Diagnoses Final diagnoses:  Pain, dental  Dental caries    Rx / DC Orders ED Discharge Orders         Ordered    penicillin v potassium (VEETID) 500 MG tablet  3 times daily        05/26/20 0142           Daysie Helf, Boyd Kerbs 05/26/20  0143    Dione Booze, MD 05/26/20 [phone]

## 2020-05-26 NOTE — Discharge Instructions (Addendum)
1. Medications: Alternate tylenol and ibuprofen - 800mg  ibuprofen 3 times a day with food.  Do not exceed 4 g of Tylenol in 24 hours.  Penicillin, usual home medications 2. Treatment: rest, drink plenty of fluids, take medications as prescribed 3. Follow Up: Please followup with dentistry within 1 week for discussion of your diagnoses and further evaluation after today's visit; if you do not have a primary care doctor use the resource guide provided to find one; Return to the ER for high fevers, difficulty breathing, difficulty swallowing or other concerning symptoms

## 2020-05-26 NOTE — ED Triage Notes (Signed)
Patient reports right upper toothache this evening .

## 2021-06-26 IMAGING — DX DG SHOULDER 2+V*R*
3 series · 3 of 3 positions shown · non-contrast
Comparison: None.

CLINICAL DATA: Right shoulder pain. No known injury.

EXAM:
RIGHT SHOULDER - 2+ VIEW

[shoulder grashey]
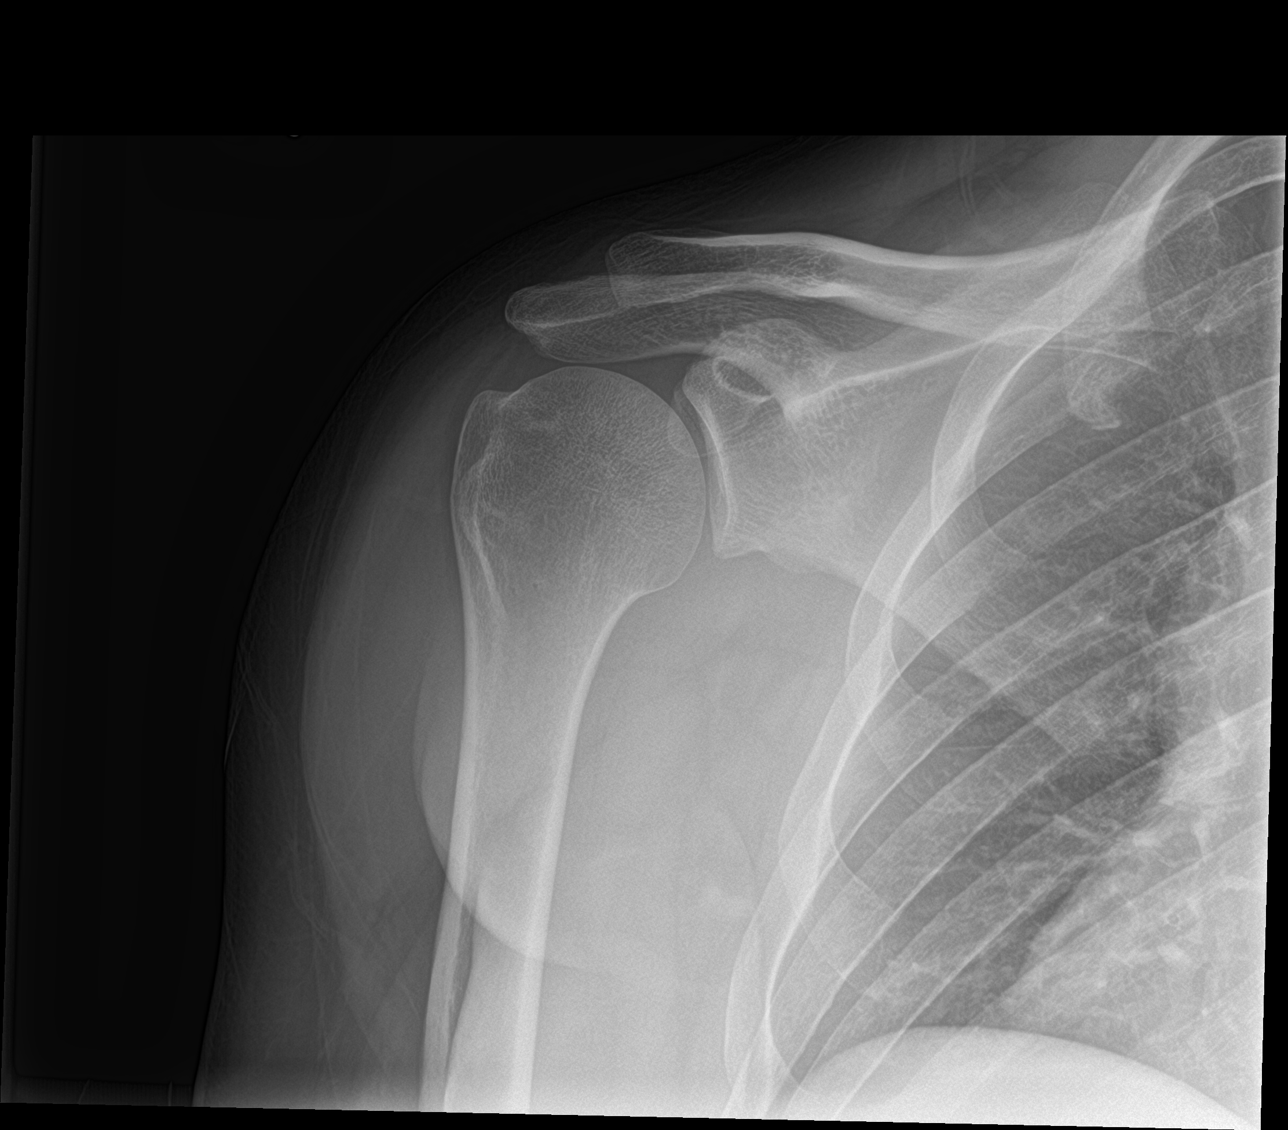

[shoulder y view]
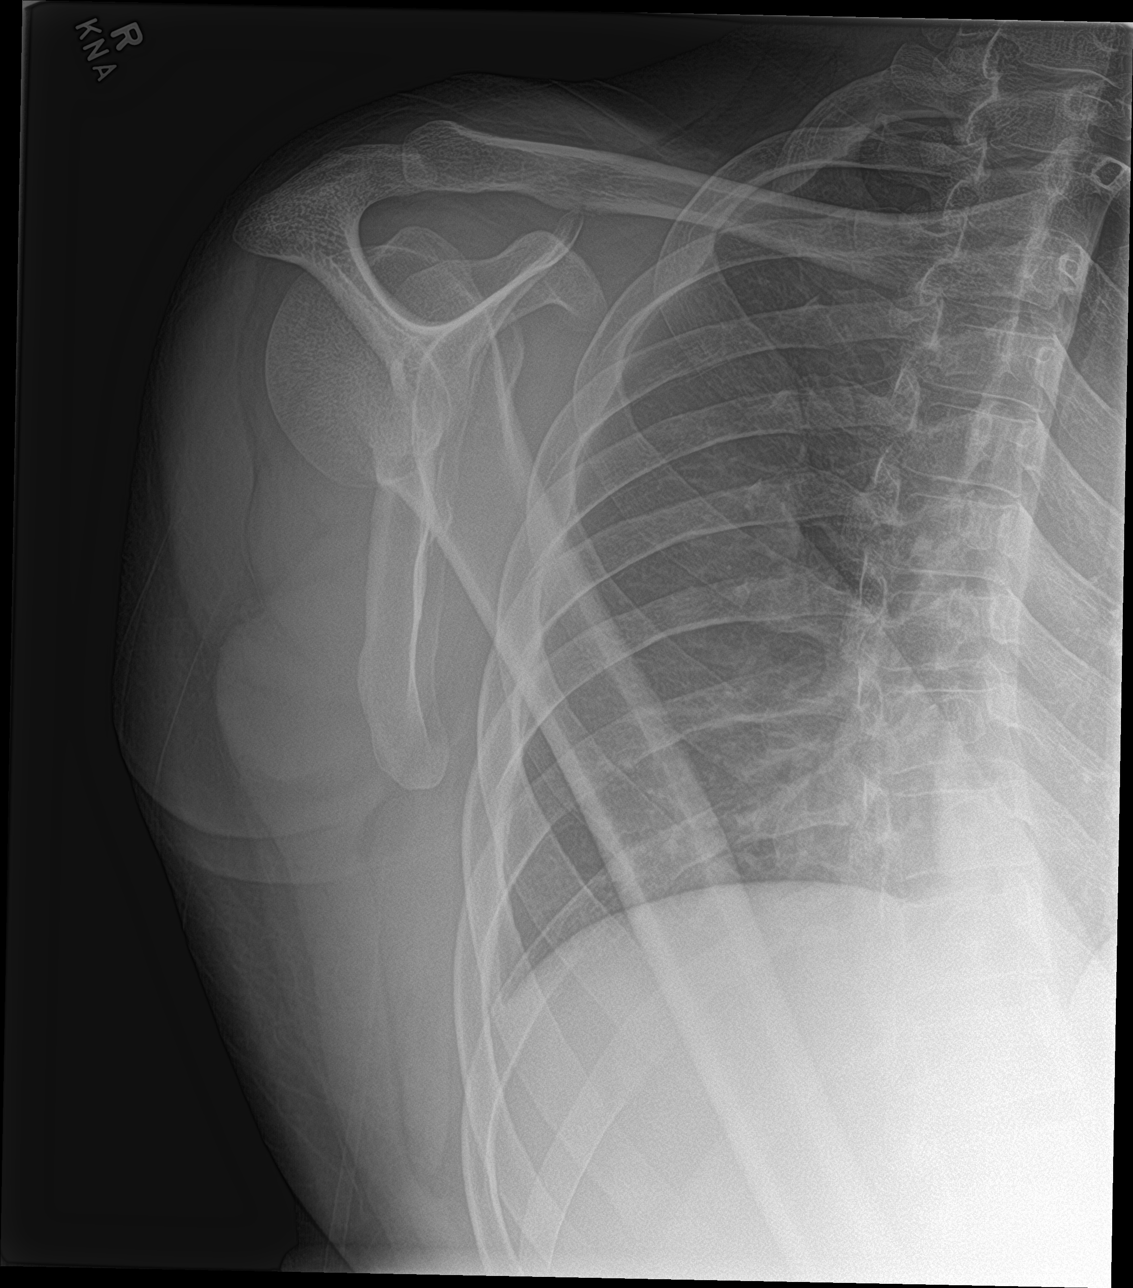

[shoulder axillary]
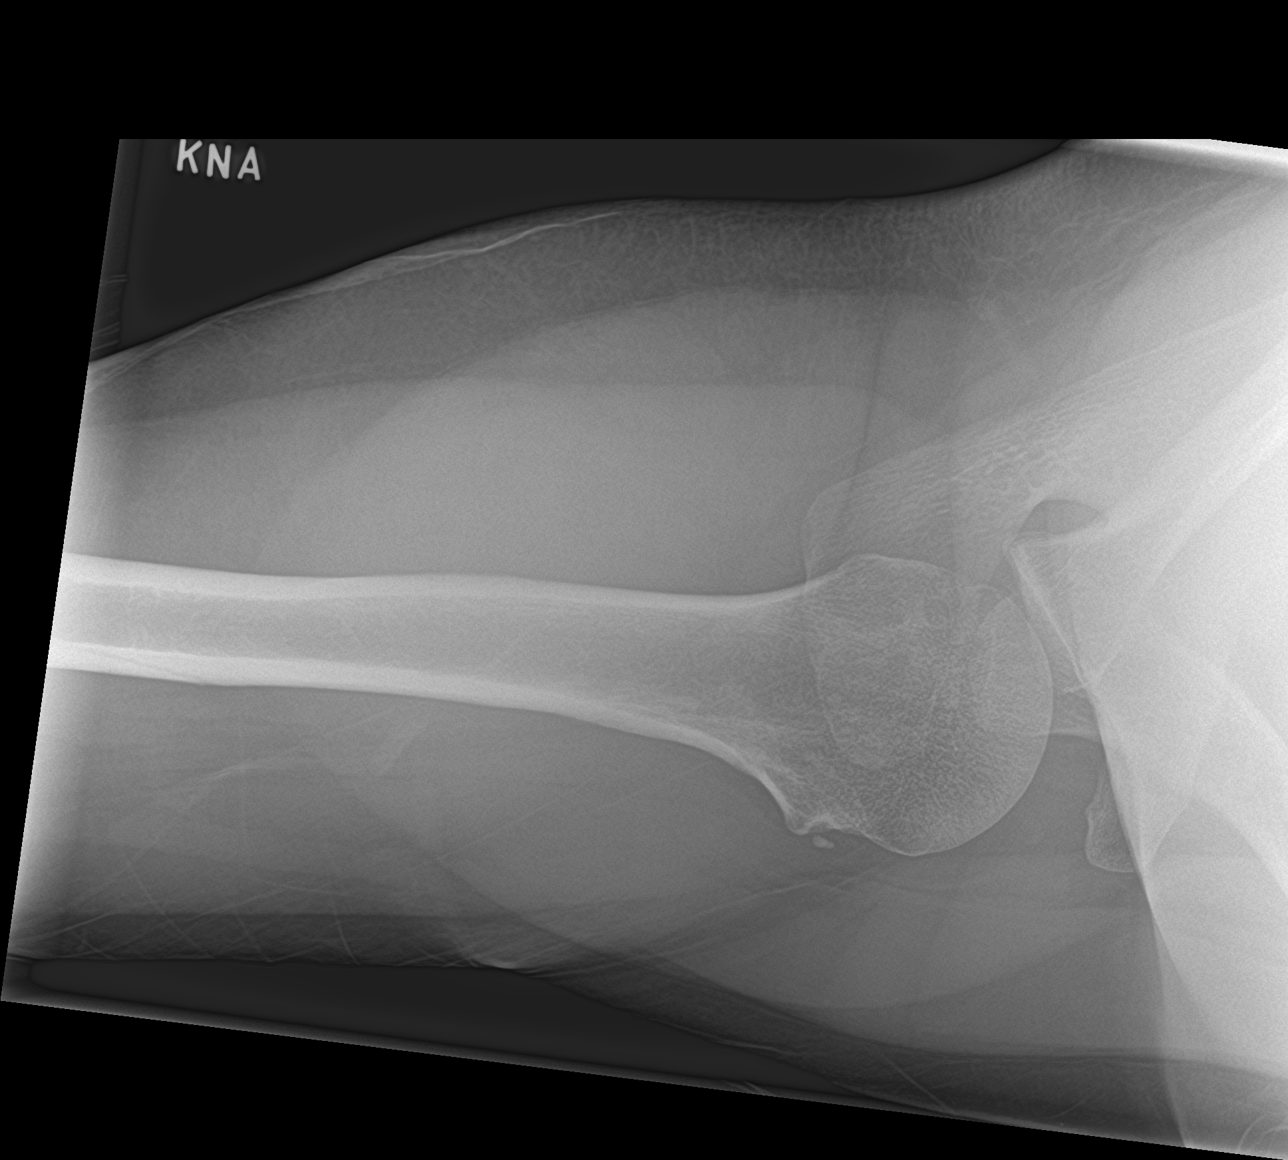

[3 of 3 positions shown; findings below may reference images not displayed]

FINDINGS: There is no evidence of fracture or dislocation. There is no
evidence of arthropathy or other focal bone abnormality. Small well
defined calcific density adjacent to the rotator cuff insertion
anteriorly.
IMPRESSION: Small calcific density adjacent to the rotator cuff insertion
anteriorly may be calcific tendinopathy. Otherwise unremarkable
radiographs of the right shoulder.

## 2021-09-14 ENCOUNTER — Encounter (HOSPITAL_COMMUNITY): Payer: Self-pay

## 2021-09-14 ENCOUNTER — Ambulatory Visit (HOSPITAL_COMMUNITY)
Admission: EM | Admit: 2021-09-14 | Discharge: 2021-09-14 | Disposition: A | Discharge status: Home / Self Care | Payer: Self-pay | Attending: Emergency Medicine | Admitting: Emergency Medicine

## 2021-09-14 DIAGNOSIS — K029 Dental caries, unspecified: Secondary | ICD-10-CM

## 2021-09-14 DIAGNOSIS — K047 Periapical abscess without sinus: Secondary | ICD-10-CM

## 2021-09-14 MED ORDER — PENICILLIN V POTASSIUM 500 MG PO TABS: 500.0000 mg | ORAL_TABLET | Freq: Three times a day (TID) | ORAL | 0 refills | Status: AC

## 2021-09-14 NOTE — ED Provider Notes (Signed)
[facility]    CSN: [id number] Arrival date & time: 09/14/21  1916    HISTORY   Chief Complaint  Patient presents with   Dental Pain   HPI Jack Green is a 36 y.o. male. Patient complains of a 2-day history of dental pain.  Patient has had the same issue in the past, EMR reviewed, in 2021 he was treated with penicillin for an infected tooth, patient states that he has a fear of dentists and has not seen a dentist since then, states the same tooth is infected now along with 2 others.  The history is provided by the patient.  History reviewed. No pertinent past medical history. There are no problems to display for this patient.  History reviewed. No pertinent surgical history.  Home Medications    Prior to Admission medications   Medication Sig Start Date End Date Taking? Authorizing Provider  benzonatate (TESSALON) 100 MG capsule Take 1 capsule (100 mg total) by mouth every 8 (eight) hours. 10/24/17   Tasia Catchings, Amy V, PA-C  fluticasone (FLONASE) 50 MCG/ACT nasal spray Place 2 sprays into both nostrils daily. 10/24/17   Tasia Catchings, Amy V, PA-C  ibuprofen (ADVIL,MOTRIN) 600 MG tablet Take 1 tablet (600 mg total) by mouth every 6 (six) hours as needed for pain. 09/29/12   Julianne Rice, MD  ipratropium (ATROVENT) 0.06 % nasal spray Place 2 sprays into both nostrils 4 (four) times daily. 10/24/17   Tasia Catchings, Amy V, PA-C  meloxicam (MOBIC) 7.5 MG tablet Take 1 tablet (7.5 mg total) by mouth daily. 10/24/17   Tasia Catchings, Amy V, PA-C  ondansetron (ZOFRAN ODT) 4 MG disintegrating tablet Take 1 tablet (4 mg total) by mouth every 8 (eight) hours as needed for nausea or vomiting. 10/24/17   Tasia Catchings, Amy V, PA-C  penicillin v potassium (VEETID) 500 MG tablet Take 1 tablet (500 mg total) by mouth 3 (three) times daily. 05/26/20   Muthersbaugh, Jarrett Soho, PA-C   Family History History reviewed. No pertinent family history. Social History Social History   Tobacco Use   Smoking status: Every Day    Packs/day:  1.00    Types: Cigarettes   Smokeless tobacco: Never  Substance Use Topics   Alcohol use: No   Drug use: No   Allergies   Patient has no known allergies.  Review of Systems Review of Systems Pertinent findings noted in history of present illness.   Physical Exam Triage Vital Signs ED Triage Vitals  Enc Vitals Group     BP 05/29/21 0827 (!) 147/82     Pulse Rate 05/29/21 0827 72     Resp 05/29/21 0827 18     Temp 05/29/21 0827 98.3 F (36.8 C)     Temp Source 05/29/21 0827 Oral     SpO2 05/29/21 0827 98 %     Weight --      Height --      Head Circumference --      Peak Flow --      Pain Score 05/29/21 0826 5     Pain Loc --      Pain Edu? --      Excl. in [place]? --   No data found.  Updated Vital Signs BP 131/80 (BP Location: Left Arm)    Pulse 61    Temp 98.1 F (36.7 C) (Oral)    Resp 16    SpO2 100%   Physical Exam Vitals and nursing note reviewed.  Constitutional:  General: He is not in acute distress.    Appearance: Normal appearance. He is not ill-appearing.  HENT:     Head: Normocephalic and atraumatic.     Mouth/Throat:     Dentition: Abnormal dentition. Dental tenderness, gingival swelling, dental caries and dental abscesses present.     Pharynx: Oropharynx is clear. Uvula midline. No pharyngeal swelling, oropharyngeal exudate or posterior oropharyngeal erythema.  Eyes:     General: Lids are normal.        Right eye: No discharge.        Left eye: No discharge.     Extraocular Movements: Extraocular movements intact.     Conjunctiva/sclera: Conjunctivae normal.     Right eye: Right conjunctiva is not injected.     Left eye: Left conjunctiva is not injected.  Neck:     Trachea: Trachea and phonation normal.  Cardiovascular:     Rate and Rhythm: Normal rate and regular rhythm.     Pulses: Normal pulses.     Heart sounds: Normal heart sounds. No murmur heard.   No friction rub. No gallop.  Pulmonary:     Effort: Pulmonary effort is normal. No  accessory muscle usage, prolonged expiration or respiratory distress.     Breath sounds: Normal breath sounds. No stridor, decreased air movement or transmitted upper airway sounds. No decreased breath sounds, wheezing, rhonchi or rales.  Chest:     Chest wall: No tenderness.  Musculoskeletal:        General: Normal range of motion.     Cervical back: Normal range of motion and neck supple. Normal range of motion.  Lymphadenopathy:     Cervical: No cervical adenopathy.  Skin:    General: Skin is warm and dry.     Findings: No erythema or rash.  Neurological:     General: No focal deficit present.     Mental Status: He is alert and oriented to person, place, and time.  Psychiatric:        Mood and Affect: Mood normal.        Behavior: Behavior normal.    Visual Acuity Right Eye Distance:   Left Eye Distance:   Bilateral Distance:    Right Eye Near:   Left Eye Near:    Bilateral Near:     UC Couse / Diagnostics / Procedures:    EKG  Radiology No results found.  Procedures Procedures (including critical care time)  UC Diagnoses / Final Clinical Impressions(s)   I have reviewed the triage vital signs and the nursing notes.  Pertinent labs & imaging results that were available during my care of the patient were reviewed by me and considered in my medical decision making (see chart for details).   Final diagnoses:  Dental caries  Pain due to dental caries  Dental infection   Patient provided with a prescription for penicillin as this is what was given to him in the past and worked well.  Patient advised of the risk of heart valve infection with untreated dental abscess.  Patient strongly encouraged to follow-up with dentistry to have dental caries addressed.  Return precautions advised.  ED Prescriptions     Medication Sig Dispense Auth. Provider   penicillin v potassium (VEETID) 500 MG tablet Take 1 tablet (500 mg total) by mouth 3 (three) times daily for 14 days. 42  tablet Lynden Oxford Scales, PA-C      PDMP not reviewed this encounter.  Pending results:  Labs Reviewed - No data  to display  Medications Ordered in UC: Medications - No data to display  Disposition Upon Discharge:  Condition: stable for discharge home Home: take medications as prescribed; routine discharge instructions as discussed; follow up as advised.  Patient presented with an acute illness with associated systemic symptoms and significant discomfort requiring urgent management. In my opinion, this is a condition that a prudent lay person (someone who possesses an average knowledge of health and medicine) may potentially expect to result in complications if not addressed urgently such as respiratory distress, impairment of bodily function or dysfunction of bodily organs.   Routine symptom specific, illness specific and/or disease specific instructions were discussed with the patient and/or caregiver at length.   As such, the patient has been evaluated and assessed, work-up was performed and treatment was provided in alignment with urgent care protocols and evidence based medicine.  Patient/parent/caregiver has been advised that the patient may require follow up for further testing and treatment if the symptoms continue in spite of treatment, as clinically indicated and appropriate.  If the patient was tested for COVID-19, Influenza and/or RSV, then the patient/parent/guardian was advised to isolate at home pending the results of his/her diagnostic coronavirus test and potentially longer if theyre positive. I have also advised pt that if his/her COVID-19 test returns positive, it's recommended to self-isolate for at least 10 days after symptoms first appeared AND until fever-free for 24 hours without fever reducer AND other symptoms have improved or resolved. Discussed self-isolation recommendations as well as instructions for household member/close contacts as per the [facility] and [place] DHHS,  and also gave patient the [place] packet with this information.  Patient/parent/caregiver has been advised to return to the [facility] or PCP in 3-5 days if no better; to PCP or the Emergency Department if new signs and symptoms develop, or if the current signs or symptoms continue to change or worsen for further workup, evaluation and treatment as clinically indicated and appropriate  The patient will follow up with their current PCP if and as advised. If the patient does not currently have a PCP we will assist them in obtaining one.   The patient may need specialty follow up if the symptoms continue, in spite of conservative treatment and management, for further workup, evaluation, consultation and treatment as clinically indicated and appropriate.  Patient/parent/caregiver verbalized understanding and agreement of plan as discussed.  All questions were addressed during visit.  Please see discharge instructions below for further details of plan.  Discharge Instructions:   Discharge Instructions      Please begin penicillin 1 tablet 3 times daily for the next 14 days.  It is very important that you see a dentist as soon as possible to have your infected teeth addressed.  As we discussed, infected teeth can put you at significant risk of infection in your heart.  Please do not wait until something catastrophic happens to get your teeth fixed.  You for visiting urgent care today.      This office note has been dictated using Museum/gallery curator.  Unfortunately, and despite my best efforts, this method of dictation can sometimes lead to occasional typographical or grammatical errors.  I apologize in advance if this occurs.     Lynden Oxford Scales, [place] 09/15/21 [phone]

## 2021-09-14 NOTE — Discharge Instructions (Signed)
Please begin penicillin 1 tablet 3 times daily for the next 14 days.  It is very important that you see a dentist as soon as possible to have your infected teeth addressed.  As we discussed, infected teeth can put you at significant risk of infection in your heart.  Please do not wait until something catastrophic happens to get your teeth fixed.  You for visiting urgent care today.

## 2021-09-14 NOTE — ED Triage Notes (Signed)
Pt presents to office for dental pain x 2 days.

## 2021-11-26 ENCOUNTER — Ambulatory Visit: Payer: Self-pay | Admitting: Nurse Practitioner

## 2021-11-26 DIAGNOSIS — Z113 Encounter for screening for infections with a predominantly sexual mode of transmission: Secondary | ICD-10-CM

## 2021-11-26 LAB — GRAM STAIN

## 2021-11-26 LAB — HM HIV SCREENING LAB: HM HIV Screening: NEGATIVE

## 2021-11-26 LAB — HM HEPATITIS C SCREENING LAB: HM Hepatitis Screen: NEGATIVE

## 2021-11-26 LAB — HEPATITIS B SURFACE ANTIGEN: Hepatitis B Surface Ag: NONREACTIVE

## 2021-11-26 NOTE — Progress Notes (Signed)
[facility] Department ?STI clinic/screening visit ? ?Subjective:  ?Jack Green is a 36 y.o. male being seen today for an STI screening visit. The patient reports they do have symptoms.   ? ?Patient has the following medical conditions:  There are no problems to display for this patient. ? ? ? ?Chief Complaint  ?Patient presents with  ? SEXUALLY TRANSMITTED DISEASE  ?  Screening  ? ? ?HPI ? ?Patient reports to clinic today for an STD screening. Patient reports discharge and dysuria for 4-5 days.   ? ?Does the patient or their partner desires a pregnancy in the next year? No ? ?Screening for MPX risk: ?Does the patient have an unexplained rash? No ?Is the patient MSM? No ?Does the patient endorse multiple sex partners or anonymous sex partners? No ?Did the patient have close or sexual contact with a person diagnosed with MPX? No ?Has the patient traveled outside the [place] where MPX is endemic? No ?Is there a high clinical suspicion for MPX-- evidenced by one of the following No ? -Unlikely to be chickenpox ? -Lymphadenopathy ? -Rash that present in same phase of evolution on any given body part ? ? ?See flowsheet for further details and programmatic requirements.  ? ? ?The following portions of the patient's history were reviewed and updated as appropriate: allergies, current medications, past medical history, past social history, past surgical history and problem list. ? ?Objective:  ?There were no vitals filed for this visit. ? ?Physical Exam ?Constitutional:   ?   Appearance: Normal appearance.  ?HENT:  ?   Head: Normocephalic.  ?   Right Ear: External ear normal.  ?   Left Ear: External ear normal.  ?   Nose: Nose normal.  ?   Mouth/Throat:  ?   Lips: Pink.  ?   Mouth: Mucous membranes are moist.  ?   Comments: Dental caries noted  ?Pulmonary:  ?   Effort: Pulmonary effort is normal.  ?Abdominal:  ?   General: Abdomen is flat.  ?   Palpations: Abdomen is soft.  ?Genitourinary: ?   Penis: Circumcised.    ?   Comments: Pubic area without nits, lice, hair loss, edema, erythema, lesions and inguinal adenopathy. ?Penis without rash, lesions and discharge at meatus. ?Testicles descended bilaterally,nt, no masses or edema.  ?Musculoskeletal:  ?   Cervical back: Full passive range of motion without pain, normal range of motion and neck supple.  ?Skin: ?   General: Skin is warm and dry.  ?Neurological:  ?   Mental Status: He is alert and oriented to person, place, and time.  ?Psychiatric:     ?   Attention and Perception: Attention normal.     ?   Mood and Affect: Mood normal.     ?   Speech: Speech normal.     ?   Behavior: Behavior is cooperative.  ? ? ? ? ?Assessment and Plan:  ?Jack Green is a 36 y.o. male presenting to the [facility] Department for STI screening ? ?1. Screening examination for venereal disease ?-36 year old male in clinic today for STD screening. ?-Patient does have STI symptoms ?Patient accepted all screenings including  oral GC and bloodwork for HIV/RPR.  ?Patient meets criteria for HepB screening? Yes. Ordered? Yes ?Patient meets criteria for HepC screening? Yes. Ordered? Yes ?Recommended condom use with all sex ?Discussed importance of condom use for STI prevent ? ?Treat gram stain per standing order ?Discussed time line  for [facility] results and that patient will be called with positive results and encouraged patient to call if he had not heard in 2 weeks ?Recommended returning for continued or worsening symptoms.   ? ?- Gonococcus culture ?- Gram stain ?- Gonococcus culture ?- HIV/HCV [place] [facility] ?- Syphilis Serology, [place] [facility] ?- HBV Antigen/[facility] ? ? ? ? ?Return if symptoms worsen or fail to improve. ? ? ? ?Glenna Fellows, FNP ?

## 2021-11-26 NOTE — Progress Notes (Signed)
Pt here for STD screening.  Gram stain results reviewed, no treatment required per Provider.  Condoms declined.  Quynh Basso M Federico Maiorino, RN  

## 2021-11-30 LAB — GONOCOCCUS CULTURE

## 2024-07-31 ENCOUNTER — Emergency Department (HOSPITAL_COMMUNITY): Admission: EM | Admit: 2024-07-31 | Discharge: 2024-07-31 | Discharge status: Against Medical Advice | Payer: Self-pay

## 2024-07-31 NOTE — ED Notes (Signed)
Unable to locate patient multiple times by staff.
